# Patient Record
Sex: Female | Born: 1945 | Smoking: Never smoker
Health system: Southern US, Community
[De-identification: ages and names within clinical notes are randomized; demographics above are authoritative.]

## PROBLEM LIST (undated history)

## (undated) DIAGNOSIS — Z789 Other specified health status: Secondary | ICD-10-CM

## (undated) DIAGNOSIS — K2270 Barrett's esophagus without dysplasia: Secondary | ICD-10-CM

## (undated) DIAGNOSIS — K219 Gastro-esophageal reflux disease without esophagitis: Secondary | ICD-10-CM

## (undated) DIAGNOSIS — K8020 Calculus of gallbladder without cholecystitis without obstruction: Secondary | ICD-10-CM

## (undated) DIAGNOSIS — E785 Hyperlipidemia, unspecified: Secondary | ICD-10-CM

## (undated) DIAGNOSIS — I10 Essential (primary) hypertension: Secondary | ICD-10-CM

## (undated) DIAGNOSIS — K317 Polyp of stomach and duodenum: Secondary | ICD-10-CM

## (undated) DIAGNOSIS — G2581 Restless legs syndrome: Secondary | ICD-10-CM

## (undated) DIAGNOSIS — E119 Type 2 diabetes mellitus without complications: Secondary | ICD-10-CM

## (undated) HISTORY — PX: ABDOMINAL HYSTERECTOMY: SHX81

## (undated) HISTORY — DX: Other specified health status: Z78.9

## (undated) HISTORY — DX: Essential (primary) hypertension: I10

## (undated) HISTORY — DX: Polyp of stomach and duodenum: K31.7

## (undated) HISTORY — DX: Restless legs syndrome: G25.81

## (undated) HISTORY — DX: Type 2 diabetes mellitus without complications: E11.9

## (undated) HISTORY — DX: Gastro-esophageal reflux disease without esophagitis: K21.9

## (undated) HISTORY — DX: Barrett's esophagus without dysplasia: K22.70

## (undated) HISTORY — PX: LAPAROSCOPIC OOPHERECTOMY: SHX6507

## (undated) HISTORY — PX: ECTOPIC PREGNANCY SURGERY: SHX613

## (undated) HISTORY — PX: CATARACT EXTRACTION: SUR2

## (undated) HISTORY — DX: Hyperlipidemia, unspecified: E78.5

## (undated) HISTORY — DX: Calculus of gallbladder without cholecystitis without obstruction: K80.20

---

## 2020-01-23 ENCOUNTER — Ambulatory Visit: Payer: Medicare HMO | Attending: Internal Medicine

## 2020-01-23 DIAGNOSIS — Z23 Encounter for immunization: Secondary | ICD-10-CM | POA: Insufficient documentation

## 2020-01-23 NOTE — Progress Notes (Signed)
   Covid-19 Vaccination Clinic  Name:  Joy Mercado    MRN: [id number] DOB: 11-Jun-1946  01/23/2020  [name] was observed post Covid-19 immunization for 15 minutes without incidence. She was provided with Vaccine Information Sheet and instruction to access the V-Safe system.   [name] was instructed to call 911 with any severe reactions post vaccine: Marland Kitchen Difficulty breathing  . Swelling of your face and throat  . A fast heartbeat  . A bad rash all over your body  . Dizziness and weakness    Immunizations Administered    Name Date Dose VIS Date Route   Pfizer COVID-19 Vaccine 01/23/2020  8:16 AM 0.3 mL 11/07/2019 Intramuscular   Manufacturer: [place]   Lot: [id number]   [place]: [id number]

## 2020-02-17 ENCOUNTER — Ambulatory Visit: Payer: Medicare HMO | Attending: Internal Medicine

## 2020-02-17 DIAGNOSIS — Z23 Encounter for immunization: Secondary | ICD-10-CM

## 2020-02-17 NOTE — Progress Notes (Signed)
   Covid-19 Vaccination Clinic  Name:  JAMILEE VOLNER    MRN: [id number] DOB: 09-20-1946  02/17/2020  Ms. Labelle was observed post Covid-19 immunization for 15 minutes without incident. She was provided with Vaccine Information Sheet and instruction to access the V-Safe system.   Ms. Meisler was instructed to call 911 with any severe reactions post vaccine: Marland Kitchen Difficulty breathing  . Swelling of face and throat  . A fast heartbeat  . A bad rash all over body  . Dizziness and weakness   Immunizations Administered    Name Date Dose VIS Date Route   Pfizer COVID-19 Vaccine 02/17/2020  8:44 AM 0.3 mL 11/07/2019 Intramuscular   Manufacturer: [place]   Lot: [id number]   [place]: [id number]

## 2020-07-19 DIAGNOSIS — E782 Mixed hyperlipidemia: Secondary | ICD-10-CM | POA: Diagnosis not present

## 2020-07-19 DIAGNOSIS — I10 Essential (primary) hypertension: Secondary | ICD-10-CM | POA: Diagnosis not present

## 2020-07-19 DIAGNOSIS — E1165 Type 2 diabetes mellitus with hyperglycemia: Secondary | ICD-10-CM | POA: Diagnosis not present

## 2020-07-19 DIAGNOSIS — K219 Gastro-esophageal reflux disease without esophagitis: Secondary | ICD-10-CM | POA: Diagnosis not present

## 2020-07-19 DIAGNOSIS — Z8719 Personal history of other diseases of the digestive system: Secondary | ICD-10-CM | POA: Diagnosis not present

## 2020-07-26 DIAGNOSIS — E1165 Type 2 diabetes mellitus with hyperglycemia: Secondary | ICD-10-CM | POA: Diagnosis not present

## 2020-07-26 DIAGNOSIS — E782 Mixed hyperlipidemia: Secondary | ICD-10-CM | POA: Diagnosis not present

## 2020-07-26 DIAGNOSIS — I10 Essential (primary) hypertension: Secondary | ICD-10-CM | POA: Diagnosis not present

## 2020-08-06 DIAGNOSIS — I10 Essential (primary) hypertension: Secondary | ICD-10-CM | POA: Diagnosis not present

## 2020-08-06 DIAGNOSIS — E1165 Type 2 diabetes mellitus with hyperglycemia: Secondary | ICD-10-CM | POA: Diagnosis not present

## 2020-08-06 DIAGNOSIS — E782 Mixed hyperlipidemia: Secondary | ICD-10-CM | POA: Diagnosis not present

## 2020-08-10 DIAGNOSIS — I10 Essential (primary) hypertension: Secondary | ICD-10-CM | POA: Diagnosis not present

## 2020-08-10 DIAGNOSIS — E1165 Type 2 diabetes mellitus with hyperglycemia: Secondary | ICD-10-CM | POA: Diagnosis not present

## 2020-08-10 DIAGNOSIS — E782 Mixed hyperlipidemia: Secondary | ICD-10-CM | POA: Diagnosis not present

## 2020-08-16 DIAGNOSIS — I10 Essential (primary) hypertension: Secondary | ICD-10-CM | POA: Diagnosis not present

## 2020-08-16 DIAGNOSIS — E782 Mixed hyperlipidemia: Secondary | ICD-10-CM | POA: Diagnosis not present

## 2020-08-16 DIAGNOSIS — E1165 Type 2 diabetes mellitus with hyperglycemia: Secondary | ICD-10-CM | POA: Diagnosis not present

## 2020-08-23 DIAGNOSIS — Z23 Encounter for immunization: Secondary | ICD-10-CM | POA: Diagnosis not present

## 2020-09-17 DIAGNOSIS — E782 Mixed hyperlipidemia: Secondary | ICD-10-CM | POA: Diagnosis not present

## 2020-09-17 DIAGNOSIS — R195 Other fecal abnormalities: Secondary | ICD-10-CM | POA: Diagnosis not present

## 2020-09-21 DIAGNOSIS — E782 Mixed hyperlipidemia: Secondary | ICD-10-CM | POA: Diagnosis not present

## 2020-09-21 DIAGNOSIS — Z789 Other specified health status: Secondary | ICD-10-CM | POA: Diagnosis not present

## 2020-09-21 DIAGNOSIS — K219 Gastro-esophageal reflux disease without esophagitis: Secondary | ICD-10-CM | POA: Diagnosis not present

## 2020-09-21 DIAGNOSIS — G2581 Restless legs syndrome: Secondary | ICD-10-CM | POA: Diagnosis not present

## 2020-09-21 DIAGNOSIS — K317 Polyp of stomach and duodenum: Secondary | ICD-10-CM | POA: Diagnosis not present

## 2020-09-21 DIAGNOSIS — E1165 Type 2 diabetes mellitus with hyperglycemia: Secondary | ICD-10-CM | POA: Diagnosis not present

## 2020-09-22 DIAGNOSIS — Z961 Presence of intraocular lens: Secondary | ICD-10-CM | POA: Diagnosis not present

## 2020-09-22 DIAGNOSIS — H35033 Hypertensive retinopathy, bilateral: Secondary | ICD-10-CM | POA: Diagnosis not present

## 2020-09-22 DIAGNOSIS — H26491 Other secondary cataract, right eye: Secondary | ICD-10-CM | POA: Diagnosis not present

## 2020-09-22 DIAGNOSIS — E119 Type 2 diabetes mellitus without complications: Secondary | ICD-10-CM | POA: Diagnosis not present

## 2020-09-22 DIAGNOSIS — H2512 Age-related nuclear cataract, left eye: Secondary | ICD-10-CM | POA: Diagnosis not present

## 2020-09-22 DIAGNOSIS — H40013 Open angle with borderline findings, low risk, bilateral: Secondary | ICD-10-CM | POA: Diagnosis not present

## 2020-10-28 DIAGNOSIS — Z0000 Encounter for general adult medical examination without abnormal findings: Secondary | ICD-10-CM | POA: Diagnosis not present

## 2020-10-28 DIAGNOSIS — Z1331 Encounter for screening for depression: Secondary | ICD-10-CM | POA: Diagnosis not present

## 2020-10-28 DIAGNOSIS — Z1339 Encounter for screening examination for other mental health and behavioral disorders: Secondary | ICD-10-CM | POA: Diagnosis not present

## 2020-11-02 DIAGNOSIS — Z23 Encounter for immunization: Secondary | ICD-10-CM | POA: Diagnosis not present

## 2020-11-12 ENCOUNTER — Other Ambulatory Visit: Payer: Self-pay | Admitting: Family Medicine

## 2020-11-12 DIAGNOSIS — E2839 Other primary ovarian failure: Secondary | ICD-10-CM

## 2020-11-12 DIAGNOSIS — Z1231 Encounter for screening mammogram for malignant neoplasm of breast: Secondary | ICD-10-CM

## 2020-11-29 ENCOUNTER — Ambulatory Visit: Payer: Self-pay | Admitting: Gastroenterology

## 2020-11-30 DIAGNOSIS — E1165 Type 2 diabetes mellitus with hyperglycemia: Secondary | ICD-10-CM | POA: Diagnosis not present

## 2020-11-30 DIAGNOSIS — I10 Essential (primary) hypertension: Secondary | ICD-10-CM | POA: Diagnosis not present

## 2020-11-30 DIAGNOSIS — E782 Mixed hyperlipidemia: Secondary | ICD-10-CM | POA: Diagnosis not present

## 2020-12-02 DIAGNOSIS — K219 Gastro-esophageal reflux disease without esophagitis: Secondary | ICD-10-CM | POA: Diagnosis not present

## 2020-12-02 DIAGNOSIS — E1165 Type 2 diabetes mellitus with hyperglycemia: Secondary | ICD-10-CM | POA: Diagnosis not present

## 2020-12-02 DIAGNOSIS — E782 Mixed hyperlipidemia: Secondary | ICD-10-CM | POA: Diagnosis not present

## 2020-12-02 DIAGNOSIS — I10 Essential (primary) hypertension: Secondary | ICD-10-CM | POA: Diagnosis not present

## 2021-01-04 ENCOUNTER — Encounter: Payer: Self-pay | Admitting: Gastroenterology

## 2021-01-04 ENCOUNTER — Ambulatory Visit: Payer: Medicare HMO | Admitting: Gastroenterology

## 2021-01-04 VITALS — BP 112/70 | HR 68 | Ht 60.0 in | Wt 133.1 lb

## 2021-01-04 DIAGNOSIS — K219 Gastro-esophageal reflux disease without esophagitis: Secondary | ICD-10-CM | POA: Diagnosis not present

## 2021-01-04 DIAGNOSIS — K31A11 Gastric intestinal metaplasia without dysplasia, involving the antrum: Secondary | ICD-10-CM

## 2021-01-04 MED ORDER — PLENVU 140 G PO SOLR: 1.0000 | Freq: Once | ORAL | 0 refills | Status: AC

## 2021-01-04 NOTE — Patient Instructions (Signed)
You have been scheduled for an endoscopy and colonoscopy. Please follow the written instructions given to you at your visit today. Please pick up your prep supplies at the pharmacy within the next 1-3 days. If you use inhalers (even only as needed), please bring them with you on the day of your procedure.  Thank you for choosing me and [facility].  Pricilla Riffle. Dagoberto Ligas., MD., Marval Regal

## 2021-01-04 NOTE — Progress Notes (Signed)
History of Present Illness: This is a 75 year old female referred by Fanny Bien, MD for the evaluation of GERD and a history of gastric intestinal metaplasia.  She is accompanied by her husband.  They relocated here about 2 years ago from [place] where they lived for many years.  Last EGD as below. She is also due for screening colonoscopy.  She feels her last colonoscopy was done in 2011.  She has GERD that is well controlled on daily omeprazole.  A few months ago she developed a change in bowel habits with 1-3 looser, more urgent bowel movements daily.  Prior bowel habits were 1 formed bowel movement per day.  No other gastrointestinal complaints. Denies weight loss, abdominal pain, constipation, diarrhea, change in stool caliber, melena, hematochezia, nausea, vomiting, dysphagia, reflux symptoms, chest pain.   EGD 08/2017 in [place], [place] [facility] - mild chronic inflammation, no intestinal metaplasia Altered antral mucosal texture -  Intestinal metaplasia. No H pylori   No Known Allergies Outpatient Medications Prior to Visit  Medication Sig Dispense Refill  . ezetimibe (ZETIA) 10 MG tablet Take 10 mg by mouth daily.    Marland Kitchen lisinopril (ZESTRIL) 10 MG tablet Take 10 mg by mouth daily.    Marland Kitchen omeprazole (PRILOSEC) 20 MG capsule Take 20 mg by mouth daily.    . metFORMIN (GLUCOPHAGE) 500 MG tablet Take by mouth 2 (two) times daily with a meal.    . metFORMIN (GLUCOPHAGE-XR) 500 MG 24 hr tablet Take 500 mg by mouth every evening.    Marland Kitchen omeprazole (PRILOSEC) 40 MG capsule Take 40 mg by mouth daily.    . simvastatin (ZOCOR) 10 MG tablet Take 10 mg by mouth daily.     No facility-administered medications prior to visit.   Past Medical History:  Diagnosis Date  . Barrett's esophagus   . Diabetes ([place])   . Gallstones   . Gastric polyp   . GERD (gastroesophageal reflux disease)   . Hyperlipidemia   . Hypertension   . RLS (restless legs syndrome)   . Statin intolerance     Past Surgical History:  Procedure Laterality Date  . ABDOMINAL HYSTERECTOMY    . CATARACT EXTRACTION Right   . ECTOPIC PREGNANCY SURGERY     x 2  . LAPAROSCOPIC OOPHERECTOMY  1970's   Social History   Socioeconomic History  . Marital status: Married    Spouse name: Not on file  . Number of children: 0  . Years of education: Not on file  . Highest education level: Not on file  Occupational History  . Occupation: retired  Tobacco Use  . Smoking status: Former Smoker    Quit date: 01/05/1988    Years since quitting: 33.0  . Smokeless tobacco: Never Used  Vaping Use  . Vaping Use: Never used  Substance and Sexual Activity  . Alcohol use: Yes    Alcohol/week: 2.0 standard drinks    Types: 2 Glasses of wine per week    Comment: 2-3 wines a week  . Drug use: Never  . Sexual activity: Not on file  Other Topics Concern  . Not on file  Social History Narrative  . Not on file   Social Determinants of Health   Financial Resource Strain: Not on file  Food Insecurity: Not on file  Transportation Needs: Not on file  Physical Activity: Not on file  Stress: Not on file  Social Connections: Not on file   Family History  Family history  unknown: Yes      Review of Systems: Pertinent positive and negative review of systems were noted in the above HPI section. All other review of systems were otherwise negative.    Physical Exam: General: Well developed, well nourished, no acute distress Head: Normocephalic and atraumatic Eyes: Sclerae anicteric, EOMI Ears: Normal auditory acuity Mouth: Not examined, mask on during Covid-19 pandemic Neck: Supple, no masses or thyromegaly Lungs: Clear throughout to auscultation Heart: Regular rate and rhythm; no murmurs, rubs or bruits Abdomen: Soft, non tender and non distended. No masses, hepatosplenomegaly or hernias noted. Normal Bowel sounds Rectal: Not done Musculoskeletal: Symmetrical with no gross deformities  Skin: No lesions on  visible extremities Pulses:  Normal pulses noted Extremities: No clubbing, cyanosis, edema or deformities noted Neurological: Alert oriented x 4, grossly nonfocal Cervical Nodes:  No significant cervical adenopathy Inguinal Nodes: No significant inguinal adenopathy Psychological:  Alert and cooperative. Normal mood and affect   Assessment and Recommendations:  1.  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  Last EGD just over 3 years ago.  A 3-year interval surveillance endoscopy was recommended.  Schedule EGD with gastric mapping. The risks (including bleeding, perforation, infection, missed lesions, medication reactions and possible hospitalization or surgery if complications occur), benefits, and alternatives to endoscopy with possible biopsy and possible dilation were discussed with the patient and they consent to proceed.   2.  GERD.  Follow antireflux measures.  Continue omeprazole 20 mg daily.  3.  Change in bowel habits with urgent diarrhea. CRC screening, average risk.  Advised her to monitor her foods and beverages as she may have developed an intolerance.  Rule out microscopic colitis and other disorders.  Schedule colonoscopy. The risks (including bleeding, perforation, infection, missed lesions, medication reactions and possible hospitalization or surgery if complications occur), benefits, and alternatives to colonoscopy with possible biopsy and possible polypectomy were discussed with the patient and they consent to proceed.     cc: Fanny Bien, [place] [place],  [place] [address]

## 2021-01-06 DIAGNOSIS — E1165 Type 2 diabetes mellitus with hyperglycemia: Secondary | ICD-10-CM | POA: Diagnosis not present

## 2021-01-06 DIAGNOSIS — K219 Gastro-esophageal reflux disease without esophagitis: Secondary | ICD-10-CM | POA: Diagnosis not present

## 2021-01-06 DIAGNOSIS — E782 Mixed hyperlipidemia: Secondary | ICD-10-CM | POA: Diagnosis not present

## 2021-01-06 DIAGNOSIS — I10 Essential (primary) hypertension: Secondary | ICD-10-CM | POA: Diagnosis not present

## 2021-02-02 ENCOUNTER — Encounter: Payer: Self-pay | Admitting: Gastroenterology

## 2021-02-04 ENCOUNTER — Ambulatory Visit (AMBULATORY_SURGERY_CENTER): Payer: Medicare HMO | Admitting: Gastroenterology

## 2021-02-04 ENCOUNTER — Encounter: Payer: Medicare HMO | Admitting: Gastroenterology

## 2021-02-04 ENCOUNTER — Other Ambulatory Visit: Payer: Self-pay | Admitting: Gastroenterology

## 2021-02-04 ENCOUNTER — Encounter: Payer: Self-pay | Admitting: Gastroenterology

## 2021-02-04 ENCOUNTER — Other Ambulatory Visit: Payer: Self-pay

## 2021-02-04 VITALS — BP 107/65 | HR 63 | Temp 97.1°F | Resp 10 | Ht 63.0 in | Wt 133.0 lb

## 2021-02-04 DIAGNOSIS — K319 Disease of stomach and duodenum, unspecified: Secondary | ICD-10-CM

## 2021-02-04 DIAGNOSIS — Z1211 Encounter for screening for malignant neoplasm of colon: Secondary | ICD-10-CM | POA: Diagnosis not present

## 2021-02-04 DIAGNOSIS — K31A11 Gastric intestinal metaplasia without dysplasia, involving the antrum: Secondary | ICD-10-CM | POA: Diagnosis not present

## 2021-02-04 DIAGNOSIS — K31A0 Gastric intestinal metaplasia, unspecified: Secondary | ICD-10-CM | POA: Diagnosis not present

## 2021-02-04 DIAGNOSIS — D123 Benign neoplasm of transverse colon: Secondary | ICD-10-CM

## 2021-02-04 DIAGNOSIS — D124 Benign neoplasm of descending colon: Secondary | ICD-10-CM

## 2021-02-04 DIAGNOSIS — K2950 Unspecified chronic gastritis without bleeding: Secondary | ICD-10-CM | POA: Diagnosis not present

## 2021-02-04 DIAGNOSIS — K2960 Other gastritis without bleeding: Secondary | ICD-10-CM | POA: Diagnosis not present

## 2021-02-04 MED ORDER — SODIUM CHLORIDE 0.9 % IV SOLN: 500.0000 mL | Freq: Once | INTRAVENOUS | Status: DC

## 2021-02-04 NOTE — Progress Notes (Signed)
Called to room to assist during endoscopic procedure.  Patient ID and intended procedure confirmed with present staff. Received instructions for my participation in the procedure from the performing physician.  

## 2021-02-04 NOTE — Patient Instructions (Signed)
Handout given:  Polyps, diverticulosis, high fiber diet Start high fiber diet Continue current medications Await pathology results  YOU HAD AN ENDOSCOPIC PROCEDURE TODAY AT [facility]:   Refer to the procedure report that was given to you for any specific questions about what was found during the examination.  If the procedure report does not answer your questions, please call your gastroenterologist to clarify.  If you requested that your care partner not be given the details of your procedure findings, then the procedure report has been included in a sealed envelope for you to review at your convenience later.  YOU SHOULD EXPECT: Some feelings of bloating in the abdomen. Passage of more gas than usual.  Walking can help get rid of the air that was put into your GI tract during the procedure and reduce the bloating. If you had a lower endoscopy (such as a colonoscopy or flexible sigmoidoscopy) you may notice spotting of blood in your stool or on the toilet paper. If you underwent a bowel prep for your procedure, you may not have a normal bowel movement for a few days.  Please Note:  You might notice some irritation and congestion in your nose or some drainage.  This is from the oxygen used during your procedure.  There is no need for concern and it should clear up in a day or so.  SYMPTOMS TO REPORT IMMEDIATELY:   Following lower endoscopy (colonoscopy or flexible sigmoidoscopy):  Excessive amounts of blood in the stool  Significant tenderness or worsening of abdominal pains  Swelling of the abdomen that is new, acute  Fever of 100F or higher   Following upper endoscopy (EGD)  Vomiting of blood or coffee ground material  New chest pain or pain under the shoulder blades  Painful or persistently difficult swallowing  New shortness of breath  Fever of 100F or higher  Black, tarry-looking stools  For urgent or emergent issues, a gastroenterologist can be reached at any  hour by calling [phone]. Do not use MyChart messaging for urgent concerns.   DIET:  We do recommend a small meal at first, but then you may proceed to your regular diet.  Drink plenty of fluids but you should avoid alcoholic beverages for 24 hours.  ACTIVITY:  You should plan to take it easy for the rest of today and you should NOT DRIVE or use heavy machinery until tomorrow (because of the sedation medicines used during the test).    FOLLOW UP: Our staff will call the number listed on your records 48-72 hours following your procedure to check on you and address any questions or concerns that you may have regarding the information given to you following your procedure. If we do not reach you, we will leave a message.  We will attempt to reach you two times.  During this call, we will ask if you have developed any symptoms of COVID 19. If you develop any symptoms (ie: fever, flu-like symptoms, shortness of breath, cough etc.) before then, please call [phone].  If you test positive for Covid 19 in the 2 weeks post procedure, please call and report this information to [place].    If any biopsies were taken you will be contacted by phone or by letter within the next 1-3 weeks.  Please call us at [phone] if you have not heard about the biopsies in 3 weeks.   SIGNATURES/CONFIDENTIALITY: You and/or your care partner have signed paperwork which will be entered into  your electronic medical record.  These signatures attest to the fact that that the information above on your After Visit Summary has been reviewed and is understood.  Full responsibility of the confidentiality of this discharge information lies with you and/or your care-partner.

## 2021-02-04 NOTE — Op Note (Signed)
[place] Patient Name: Joy Mercado Procedure Date: 02/04/2021 1:33 PM MRN: [id number] Endoscopist: Ladene Artist , MD Age: 75 Referring MD:  Date of Birth: 1946/11/20 Gender: Female Account #: [id number] Procedure:                Colonoscopy Indications:              Screening for colorectal malignant neoplasm Medicines:                Monitored Anesthesia Care Procedure:                Pre-Anesthesia Assessment:                           - Prior to the procedure, a History and Physical                            was performed, and patient medications and                            allergies were reviewed. The patient's tolerance of                            previous anesthesia was also reviewed. The risks                            and benefits of the procedure and the sedation                            options and risks were discussed with the patient.                            All questions were answered, and informed consent                            was obtained. Prior Anticoagulants: The patient has                            taken no previous anticoagulant or antiplatelet                            agents. ASA Grade Assessment: II - A patient with                            mild systemic disease. After reviewing the risks                            and benefits, the patient was deemed in                            satisfactory condition to undergo the procedure.                           After obtaining informed consent, the colonoscope  was passed under direct vision. Throughout the                            procedure, the patient's blood pressure, pulse, and                            oxygen saturations were monitored continuously. The                            Olympus PFC-[id number]) Colonoscope was                            introduced through the anus and advanced to the the                            cecum, identified by  appendiceal orifice and                            ileocecal valve. The ileocecal valve, appendiceal                            orifice, and rectum were photographed. The quality                            of the bowel preparation was good. The colonoscopy                            was performed without difficulty. The patient                            tolerated the procedure well. Scope In: 1:47:25 PM Scope Out: 2:06:27 PM Scope Withdrawal Time: 0 hours 13 minutes 52 seconds  Total Procedure Duration: 0 hours 19 minutes 2 seconds  Findings:                 The perianal and digital rectal examinations were                            normal.                           Two sessile polyps were found in the descending                            colon and transverse colon. The polyps were 6 to 7                            mm in size. These polyps were removed with a cold                            snare. Resection and retrieval were complete.                           Multiple medium-mouthed diverticula were found in  the right colon. There was evidence of an impacted                            diverticulum.                           A tattoo was seen in the sigmoid colon. The tattoo                            site appeared normal.                           The exam was otherwise without abnormality on                            direct and retroflexion views. Complications:            No immediate complications. Estimated blood loss:                            None. Estimated Blood Loss:     Estimated blood loss: none. Impression:               - Two 6 to 7 mm polyps in the descending colon and                            in the transverse colon, removed with a cold snare.                            Resected and retrieved.                           - Moderate diverticulosis in the right colon.                           - A tattoo was seen in the sigmoid colon. The                             tattoo site appeared normal.                           - The examination was otherwise normal on direct                            and retroflexion views. Recommendation:           - Repeat colonoscopy after studies are complete for                            surveillance based on pathology results.                           - Patient has a contact number available for                            emergencies. The signs and symptoms of  potential                            delayed complications were discussed with the                            patient. Return to normal activities tomorrow.                            Written discharge instructions were provided to the                            patient.                           - High fiber diet.                           - Continue present medications.                           - Await pathology results. Ladene Artist, MD 02/04/2021 2:09:48 PM This report has been signed electronically.

## 2021-02-04 NOTE — Progress Notes (Signed)
PT taken to PACU. Monitors in place. VSS. Report given to RN. 

## 2021-02-04 NOTE — Progress Notes (Signed)
Vitals-CW  History reviewed.  Patient states ringing in L ear started this morning. Denies nausea and chest pain.

## 2021-02-04 NOTE — Op Note (Signed)
[facility] Patient Name: Joy Mercado Procedure Date: 02/04/2021 1:33 PM MRN: [id number] Endoscopist: Ladene Artist , MD Age: 75 Referring MD:  Date of Birth: December 01, 1945 Gender: Female Account #: [id number] Procedure:                Upper GI endoscopy Indications:              Surveillance procedure: Gastric intestinal                            metaplasia. Medicines:                Monitored Anesthesia Care Procedure:                Pre-Anesthesia Assessment:                           - Prior to the procedure, a History and Physical                            was performed, and patient medications and                            allergies were reviewed. The patient's tolerance of                            previous anesthesia was also reviewed. The risks                            and benefits of the procedure and the sedation                            options and risks were discussed with the patient.                            All questions were answered, and informed consent                            was obtained. Prior Anticoagulants: The patient has                            taken no previous anticoagulant or antiplatelet                            agents. ASA Grade Assessment: II - A patient with                            mild systemic disease. After reviewing the risks                            and benefits, the patient was deemed in                            satisfactory condition to undergo the procedure.  After obtaining informed consent, the endoscope was                            passed under direct vision. Throughout the                            procedure, the patient's blood pressure, pulse, and                            oxygen saturations were monitored continuously. The                            Endoscope was introduced through the mouth, and                            advanced to the second part of duodenum. The upper                             GI endoscopy was accomplished without difficulty.                            The patient tolerated the procedure well. Scope In: Scope Out: Findings:                 The examined esophagus was normal.                           Diffuse mildly erythematous mucosa without bleeding                            was found in the gastric antrum. Biopsies were                            taken from the lesser and greater curvatures with a                            cold forceps for histology.                           The gastric body was normal. Biopsies were taken                            the lesser and greater curvatures and from the                            incisura with a cold forceps for histology.                           The cardia and gastric fundus were normal.                           The duodenal bulb and second portion of the  duodenum were normal. Complications:            No immediate complications. Estimated Blood Loss:     Estimated blood loss was minimal. Impression:               - Normal esophagus.                           - Erythematous mucosa in the antrum. Biopsied.                           - Normal gastric body. Biopsied.                           - Normal cardia and gastric fundus.                           - Normal duodenal bulb and second portion of the                            duodenum. Recommendation:           - Patient has a contact number available for                            emergencies. The signs and symptoms of potential                            delayed complications were discussed with the                            patient. Return to normal activities tomorrow.                            Written discharge instructions were provided to the                            patient.                           - Resume previous diet.                           - Continue present medications.                            - Await pathology results.                           - Repeat upper endoscopy in 3 years for                            surveillance pending pathology review. Ladene Artist, MD 02/04/2021 2:25:22 PM This report has been signed electronically.

## 2021-02-09 ENCOUNTER — Telehealth: Payer: Self-pay | Admitting: *Deleted

## 2021-02-09 NOTE — Telephone Encounter (Signed)
  Follow up Call-  Call back number 02/04/2021  Post procedure Call Back phone  # [phone]  husband  Permission to leave phone message Yes     Patient questions:  Do you have a fever, pain , or abdominal swelling? No. Pain Score  0 *  Have you tolerated food without any problems? No.  Have you been able to return to your normal activities? yes  Do you have any questions about your discharge instructions: Diet   No. Medications  No. Follow up visit  No.  Do you have questions or concerns about your Care? No.  Actions: * If pain score is 4 or above: No action needed, pain <4.   1. Have you developed a fever since your procedure? no  2.   Have you had an respiratory symptoms (SOB or cough) since your procedure? no  3.   Have you tested positive for COVID 19 since your procedure no  4.   Have you had any family members/close contacts diagnosed with the COVID 19 since your procedure?  no   If yes to any of these questions please route to Joylene John, RN and Joella Prince, RN

## 2021-02-09 NOTE — Telephone Encounter (Signed)
No answer for post procedure call back. Left message and will call back this afternoon.

## 2021-02-16 ENCOUNTER — Encounter: Payer: Self-pay | Admitting: Gastroenterology

## 2021-02-23 ENCOUNTER — Ambulatory Visit: Payer: Self-pay

## 2021-02-23 ENCOUNTER — Other Ambulatory Visit: Payer: Self-pay

## 2021-02-24 DIAGNOSIS — E1165 Type 2 diabetes mellitus with hyperglycemia: Secondary | ICD-10-CM | POA: Diagnosis not present

## 2021-02-24 DIAGNOSIS — E782 Mixed hyperlipidemia: Secondary | ICD-10-CM | POA: Diagnosis not present

## 2021-03-01 DIAGNOSIS — I152 Hypertension secondary to endocrine disorders: Secondary | ICD-10-CM | POA: Diagnosis not present

## 2021-03-01 DIAGNOSIS — E1165 Type 2 diabetes mellitus with hyperglycemia: Secondary | ICD-10-CM | POA: Diagnosis not present

## 2021-03-01 DIAGNOSIS — I10 Essential (primary) hypertension: Secondary | ICD-10-CM | POA: Diagnosis not present

## 2021-03-01 DIAGNOSIS — K635 Polyp of colon: Secondary | ICD-10-CM | POA: Diagnosis not present

## 2021-03-01 DIAGNOSIS — K219 Gastro-esophageal reflux disease without esophagitis: Secondary | ICD-10-CM | POA: Diagnosis not present

## 2021-03-10 DIAGNOSIS — E1165 Type 2 diabetes mellitus with hyperglycemia: Secondary | ICD-10-CM | POA: Diagnosis not present

## 2021-03-10 DIAGNOSIS — E782 Mixed hyperlipidemia: Secondary | ICD-10-CM | POA: Diagnosis not present

## 2021-03-10 DIAGNOSIS — I10 Essential (primary) hypertension: Secondary | ICD-10-CM | POA: Diagnosis not present

## 2021-03-10 DIAGNOSIS — K219 Gastro-esophageal reflux disease without esophagitis: Secondary | ICD-10-CM | POA: Diagnosis not present

## 2021-04-12 DIAGNOSIS — K219 Gastro-esophageal reflux disease without esophagitis: Secondary | ICD-10-CM | POA: Diagnosis not present

## 2021-04-12 DIAGNOSIS — I10 Essential (primary) hypertension: Secondary | ICD-10-CM | POA: Diagnosis not present

## 2021-04-12 DIAGNOSIS — E1165 Type 2 diabetes mellitus with hyperglycemia: Secondary | ICD-10-CM | POA: Diagnosis not present

## 2021-04-15 ENCOUNTER — Other Ambulatory Visit: Payer: Self-pay

## 2021-04-15 ENCOUNTER — Ambulatory Visit
Admission: RE | Admit: 2021-04-15 | Discharge: 2021-04-15 | Disposition: A | Discharge status: Home / Self Care | Payer: Medicare HMO | Source: Ambulatory Visit | Attending: Family Medicine | Admitting: Family Medicine

## 2021-04-15 DIAGNOSIS — Z1231 Encounter for screening mammogram for malignant neoplasm of breast: Secondary | ICD-10-CM | POA: Diagnosis not present

## 2021-04-19 ENCOUNTER — Other Ambulatory Visit: Payer: Self-pay | Admitting: Family Medicine

## 2021-04-19 DIAGNOSIS — R928 Other abnormal and inconclusive findings on diagnostic imaging of breast: Secondary | ICD-10-CM

## 2021-05-11 ENCOUNTER — Other Ambulatory Visit: Payer: Self-pay

## 2021-05-11 ENCOUNTER — Ambulatory Visit: Payer: Medicare HMO

## 2021-05-11 ENCOUNTER — Ambulatory Visit
Admission: RE | Admit: 2021-05-11 | Discharge: 2021-05-11 | Disposition: A | Discharge status: Home / Self Care | Payer: Medicare HMO | Source: Ambulatory Visit | Attending: Family Medicine | Admitting: Family Medicine

## 2021-05-11 DIAGNOSIS — R928 Other abnormal and inconclusive findings on diagnostic imaging of breast: Secondary | ICD-10-CM

## 2021-05-11 DIAGNOSIS — R922 Inconclusive mammogram: Secondary | ICD-10-CM | POA: Diagnosis not present

## 2021-05-25 DIAGNOSIS — E782 Mixed hyperlipidemia: Secondary | ICD-10-CM | POA: Diagnosis not present

## 2021-05-25 DIAGNOSIS — E1165 Type 2 diabetes mellitus with hyperglycemia: Secondary | ICD-10-CM | POA: Diagnosis not present

## 2021-05-25 DIAGNOSIS — I10 Essential (primary) hypertension: Secondary | ICD-10-CM | POA: Diagnosis not present

## 2021-05-25 DIAGNOSIS — K219 Gastro-esophageal reflux disease without esophagitis: Secondary | ICD-10-CM | POA: Diagnosis not present

## 2021-06-27 DIAGNOSIS — Z23 Encounter for immunization: Secondary | ICD-10-CM | POA: Diagnosis not present

## 2021-07-11 DIAGNOSIS — E1165 Type 2 diabetes mellitus with hyperglycemia: Secondary | ICD-10-CM | POA: Diagnosis not present

## 2021-07-11 DIAGNOSIS — E782 Mixed hyperlipidemia: Secondary | ICD-10-CM | POA: Diagnosis not present

## 2021-07-11 DIAGNOSIS — I10 Essential (primary) hypertension: Secondary | ICD-10-CM | POA: Diagnosis not present

## 2021-07-13 DIAGNOSIS — E782 Mixed hyperlipidemia: Secondary | ICD-10-CM | POA: Diagnosis not present

## 2021-07-13 DIAGNOSIS — K317 Polyp of stomach and duodenum: Secondary | ICD-10-CM | POA: Diagnosis not present

## 2021-07-13 DIAGNOSIS — E1165 Type 2 diabetes mellitus with hyperglycemia: Secondary | ICD-10-CM | POA: Diagnosis not present

## 2021-07-13 DIAGNOSIS — K219 Gastro-esophageal reflux disease without esophagitis: Secondary | ICD-10-CM | POA: Diagnosis not present

## 2021-07-13 DIAGNOSIS — I10 Essential (primary) hypertension: Secondary | ICD-10-CM | POA: Diagnosis not present

## 2021-07-29 ENCOUNTER — Other Ambulatory Visit: Payer: Self-pay | Admitting: Family Medicine

## 2021-07-29 DIAGNOSIS — E2839 Other primary ovarian failure: Secondary | ICD-10-CM

## 2021-08-03 ENCOUNTER — Other Ambulatory Visit: Payer: Medicare HMO

## 2021-08-08 DIAGNOSIS — Z23 Encounter for immunization: Secondary | ICD-10-CM | POA: Diagnosis not present

## 2021-10-06 DIAGNOSIS — E782 Mixed hyperlipidemia: Secondary | ICD-10-CM | POA: Diagnosis not present

## 2021-10-06 DIAGNOSIS — E1165 Type 2 diabetes mellitus with hyperglycemia: Secondary | ICD-10-CM | POA: Diagnosis not present

## 2021-10-13 DIAGNOSIS — I10 Essential (primary) hypertension: Secondary | ICD-10-CM | POA: Diagnosis not present

## 2021-10-13 DIAGNOSIS — E1165 Type 2 diabetes mellitus with hyperglycemia: Secondary | ICD-10-CM | POA: Diagnosis not present

## 2021-10-13 DIAGNOSIS — K219 Gastro-esophageal reflux disease without esophagitis: Secondary | ICD-10-CM | POA: Diagnosis not present

## 2021-10-13 DIAGNOSIS — M722 Plantar fascial fibromatosis: Secondary | ICD-10-CM | POA: Diagnosis not present

## 2021-10-13 DIAGNOSIS — E782 Mixed hyperlipidemia: Secondary | ICD-10-CM | POA: Diagnosis not present

## 2021-10-31 DIAGNOSIS — Z1331 Encounter for screening for depression: Secondary | ICD-10-CM | POA: Diagnosis not present

## 2021-10-31 DIAGNOSIS — Z1339 Encounter for screening examination for other mental health and behavioral disorders: Secondary | ICD-10-CM | POA: Diagnosis not present

## 2021-10-31 DIAGNOSIS — E782 Mixed hyperlipidemia: Secondary | ICD-10-CM | POA: Diagnosis not present

## 2021-10-31 DIAGNOSIS — M722 Plantar fascial fibromatosis: Secondary | ICD-10-CM | POA: Diagnosis not present

## 2021-10-31 DIAGNOSIS — Z0000 Encounter for general adult medical examination without abnormal findings: Secondary | ICD-10-CM | POA: Diagnosis not present

## 2021-11-15 DIAGNOSIS — H52203 Unspecified astigmatism, bilateral: Secondary | ICD-10-CM | POA: Diagnosis not present

## 2021-11-15 DIAGNOSIS — H26491 Other secondary cataract, right eye: Secondary | ICD-10-CM | POA: Diagnosis not present

## 2021-11-15 DIAGNOSIS — E119 Type 2 diabetes mellitus without complications: Secondary | ICD-10-CM | POA: Diagnosis not present

## 2021-11-15 DIAGNOSIS — H2512 Age-related nuclear cataract, left eye: Secondary | ICD-10-CM | POA: Diagnosis not present

## 2021-11-22 DIAGNOSIS — Z23 Encounter for immunization: Secondary | ICD-10-CM | POA: Diagnosis not present

## 2021-12-13 ENCOUNTER — Ambulatory Visit
Admission: RE | Admit: 2021-12-13 | Discharge: 2021-12-13 | Disposition: A | Discharge status: Home / Self Care | Payer: Medicare HMO | Source: Ambulatory Visit | Attending: Family Medicine | Admitting: Family Medicine

## 2021-12-13 DIAGNOSIS — Z780 Asymptomatic menopausal state: Secondary | ICD-10-CM | POA: Diagnosis not present

## 2021-12-13 DIAGNOSIS — M85832 Other specified disorders of bone density and structure, left forearm: Secondary | ICD-10-CM | POA: Diagnosis not present

## 2021-12-13 DIAGNOSIS — E2839 Other primary ovarian failure: Secondary | ICD-10-CM

## 2022-01-09 DIAGNOSIS — E559 Vitamin D deficiency, unspecified: Secondary | ICD-10-CM | POA: Diagnosis not present

## 2022-01-09 DIAGNOSIS — K219 Gastro-esophageal reflux disease without esophagitis: Secondary | ICD-10-CM | POA: Diagnosis not present

## 2022-01-09 DIAGNOSIS — E782 Mixed hyperlipidemia: Secondary | ICD-10-CM | POA: Diagnosis not present

## 2022-01-09 DIAGNOSIS — E1165 Type 2 diabetes mellitus with hyperglycemia: Secondary | ICD-10-CM | POA: Diagnosis not present

## 2022-01-12 DIAGNOSIS — M8580 Other specified disorders of bone density and structure, unspecified site: Secondary | ICD-10-CM | POA: Diagnosis not present

## 2022-01-12 DIAGNOSIS — E1121 Type 2 diabetes mellitus with diabetic nephropathy: Secondary | ICD-10-CM | POA: Diagnosis not present

## 2022-01-12 DIAGNOSIS — K219 Gastro-esophageal reflux disease without esophagitis: Secondary | ICD-10-CM | POA: Diagnosis not present

## 2022-01-12 DIAGNOSIS — E1165 Type 2 diabetes mellitus with hyperglycemia: Secondary | ICD-10-CM | POA: Diagnosis not present

## 2022-01-12 DIAGNOSIS — I10 Essential (primary) hypertension: Secondary | ICD-10-CM | POA: Diagnosis not present

## 2022-01-12 DIAGNOSIS — E782 Mixed hyperlipidemia: Secondary | ICD-10-CM | POA: Diagnosis not present

## 2022-02-06 DIAGNOSIS — K219 Gastro-esophageal reflux disease without esophagitis: Secondary | ICD-10-CM | POA: Diagnosis not present

## 2022-02-06 DIAGNOSIS — I10 Essential (primary) hypertension: Secondary | ICD-10-CM | POA: Diagnosis not present

## 2022-02-06 DIAGNOSIS — M8580 Other specified disorders of bone density and structure, unspecified site: Secondary | ICD-10-CM | POA: Diagnosis not present

## 2022-02-06 DIAGNOSIS — G4700 Insomnia, unspecified: Secondary | ICD-10-CM | POA: Diagnosis not present

## 2022-02-07 DIAGNOSIS — R109 Unspecified abdominal pain: Secondary | ICD-10-CM | POA: Diagnosis not present

## 2022-04-07 DIAGNOSIS — E1165 Type 2 diabetes mellitus with hyperglycemia: Secondary | ICD-10-CM | POA: Diagnosis not present

## 2022-04-07 DIAGNOSIS — I10 Essential (primary) hypertension: Secondary | ICD-10-CM | POA: Diagnosis not present

## 2022-04-07 DIAGNOSIS — E559 Vitamin D deficiency, unspecified: Secondary | ICD-10-CM | POA: Diagnosis not present

## 2022-04-12 ENCOUNTER — Other Ambulatory Visit: Payer: Self-pay | Admitting: Family Medicine

## 2022-04-12 DIAGNOSIS — I10 Essential (primary) hypertension: Secondary | ICD-10-CM | POA: Diagnosis not present

## 2022-04-12 DIAGNOSIS — E1165 Type 2 diabetes mellitus with hyperglycemia: Secondary | ICD-10-CM | POA: Diagnosis not present

## 2022-04-12 DIAGNOSIS — E559 Vitamin D deficiency, unspecified: Secondary | ICD-10-CM | POA: Diagnosis not present

## 2022-04-12 DIAGNOSIS — Z1231 Encounter for screening mammogram for malignant neoplasm of breast: Secondary | ICD-10-CM

## 2022-04-18 ENCOUNTER — Ambulatory Visit
Admission: RE | Admit: 2022-04-18 | Discharge: 2022-04-18 | Disposition: A | Discharge status: Home / Self Care | Payer: Medicare HMO | Source: Ambulatory Visit | Attending: Family Medicine | Admitting: Family Medicine

## 2022-04-18 DIAGNOSIS — Z1231 Encounter for screening mammogram for malignant neoplasm of breast: Secondary | ICD-10-CM | POA: Diagnosis not present

## 2022-05-17 DIAGNOSIS — H25012 Cortical age-related cataract, left eye: Secondary | ICD-10-CM | POA: Diagnosis not present

## 2022-05-17 DIAGNOSIS — H26491 Other secondary cataract, right eye: Secondary | ICD-10-CM | POA: Diagnosis not present

## 2022-05-17 DIAGNOSIS — H2512 Age-related nuclear cataract, left eye: Secondary | ICD-10-CM | POA: Diagnosis not present

## 2022-05-17 DIAGNOSIS — E119 Type 2 diabetes mellitus without complications: Secondary | ICD-10-CM | POA: Diagnosis not present

## 2022-06-06 DIAGNOSIS — H26491 Other secondary cataract, right eye: Secondary | ICD-10-CM | POA: Diagnosis not present

## 2022-07-04 DIAGNOSIS — I10 Essential (primary) hypertension: Secondary | ICD-10-CM | POA: Diagnosis not present

## 2022-07-04 DIAGNOSIS — E1165 Type 2 diabetes mellitus with hyperglycemia: Secondary | ICD-10-CM | POA: Diagnosis not present

## 2022-07-04 DIAGNOSIS — E782 Mixed hyperlipidemia: Secondary | ICD-10-CM | POA: Diagnosis not present

## 2022-07-06 DIAGNOSIS — H269 Unspecified cataract: Secondary | ICD-10-CM | POA: Diagnosis not present

## 2022-07-06 DIAGNOSIS — H2512 Age-related nuclear cataract, left eye: Secondary | ICD-10-CM | POA: Diagnosis not present

## 2022-07-06 DIAGNOSIS — H25812 Combined forms of age-related cataract, left eye: Secondary | ICD-10-CM | POA: Diagnosis not present

## 2022-07-06 DIAGNOSIS — H25012 Cortical age-related cataract, left eye: Secondary | ICD-10-CM | POA: Diagnosis not present

## 2022-07-12 DIAGNOSIS — E1143 Type 2 diabetes mellitus with diabetic autonomic (poly)neuropathy: Secondary | ICD-10-CM | POA: Diagnosis not present

## 2022-07-12 DIAGNOSIS — Z23 Encounter for immunization: Secondary | ICD-10-CM | POA: Diagnosis not present

## 2022-07-12 DIAGNOSIS — E1165 Type 2 diabetes mellitus with hyperglycemia: Secondary | ICD-10-CM | POA: Diagnosis not present

## 2022-07-12 DIAGNOSIS — E782 Mixed hyperlipidemia: Secondary | ICD-10-CM | POA: Diagnosis not present

## 2022-07-12 DIAGNOSIS — Z7185 Encounter for immunization safety counseling: Secondary | ICD-10-CM | POA: Diagnosis not present

## 2022-07-12 DIAGNOSIS — I10 Essential (primary) hypertension: Secondary | ICD-10-CM | POA: Diagnosis not present

## 2022-10-13 DIAGNOSIS — E1165 Type 2 diabetes mellitus with hyperglycemia: Secondary | ICD-10-CM | POA: Diagnosis not present

## 2022-10-16 DIAGNOSIS — E782 Mixed hyperlipidemia: Secondary | ICD-10-CM | POA: Diagnosis not present

## 2022-10-16 DIAGNOSIS — E1165 Type 2 diabetes mellitus with hyperglycemia: Secondary | ICD-10-CM | POA: Diagnosis not present

## 2022-10-16 DIAGNOSIS — I10 Essential (primary) hypertension: Secondary | ICD-10-CM | POA: Diagnosis not present

## 2022-10-16 DIAGNOSIS — K219 Gastro-esophageal reflux disease without esophagitis: Secondary | ICD-10-CM | POA: Diagnosis not present

## 2022-10-16 DIAGNOSIS — Z7185 Encounter for immunization safety counseling: Secondary | ICD-10-CM | POA: Diagnosis not present

## 2022-11-01 DIAGNOSIS — Z1331 Encounter for screening for depression: Secondary | ICD-10-CM | POA: Diagnosis not present

## 2022-11-01 DIAGNOSIS — Z0000 Encounter for general adult medical examination without abnormal findings: Secondary | ICD-10-CM | POA: Diagnosis not present

## 2022-11-01 DIAGNOSIS — Z1339 Encounter for screening examination for other mental health and behavioral disorders: Secondary | ICD-10-CM | POA: Diagnosis not present

## 2022-11-28 DIAGNOSIS — I10 Essential (primary) hypertension: Secondary | ICD-10-CM | POA: Diagnosis not present

## 2022-11-28 DIAGNOSIS — E782 Mixed hyperlipidemia: Secondary | ICD-10-CM | POA: Diagnosis not present

## 2022-12-05 DIAGNOSIS — L719 Rosacea, unspecified: Secondary | ICD-10-CM | POA: Diagnosis not present

## 2022-12-05 DIAGNOSIS — I10 Essential (primary) hypertension: Secondary | ICD-10-CM | POA: Diagnosis not present

## 2022-12-05 DIAGNOSIS — K219 Gastro-esophageal reflux disease without esophagitis: Secondary | ICD-10-CM | POA: Diagnosis not present

## 2022-12-05 DIAGNOSIS — E782 Mixed hyperlipidemia: Secondary | ICD-10-CM | POA: Diagnosis not present

## 2023-01-29 DIAGNOSIS — E559 Vitamin D deficiency, unspecified: Secondary | ICD-10-CM | POA: Diagnosis not present

## 2023-01-29 DIAGNOSIS — I10 Essential (primary) hypertension: Secondary | ICD-10-CM | POA: Diagnosis not present

## 2023-01-29 DIAGNOSIS — E782 Mixed hyperlipidemia: Secondary | ICD-10-CM | POA: Diagnosis not present

## 2023-01-29 DIAGNOSIS — E1165 Type 2 diabetes mellitus with hyperglycemia: Secondary | ICD-10-CM | POA: Diagnosis not present

## 2023-02-05 DIAGNOSIS — E559 Vitamin D deficiency, unspecified: Secondary | ICD-10-CM | POA: Diagnosis not present

## 2023-02-05 DIAGNOSIS — Z7185 Encounter for immunization safety counseling: Secondary | ICD-10-CM | POA: Diagnosis not present

## 2023-02-05 DIAGNOSIS — K219 Gastro-esophageal reflux disease without esophagitis: Secondary | ICD-10-CM | POA: Diagnosis not present

## 2023-02-05 DIAGNOSIS — E782 Mixed hyperlipidemia: Secondary | ICD-10-CM | POA: Diagnosis not present

## 2023-02-05 DIAGNOSIS — E1165 Type 2 diabetes mellitus with hyperglycemia: Secondary | ICD-10-CM | POA: Diagnosis not present

## 2023-02-05 DIAGNOSIS — G2581 Restless legs syndrome: Secondary | ICD-10-CM | POA: Diagnosis not present

## 2023-02-05 DIAGNOSIS — L719 Rosacea, unspecified: Secondary | ICD-10-CM | POA: Diagnosis not present

## 2023-02-05 DIAGNOSIS — E1121 Type 2 diabetes mellitus with diabetic nephropathy: Secondary | ICD-10-CM | POA: Diagnosis not present

## 2023-03-21 ENCOUNTER — Other Ambulatory Visit: Payer: Self-pay | Admitting: Family Medicine

## 2023-03-21 DIAGNOSIS — Z1231 Encounter for screening mammogram for malignant neoplasm of breast: Secondary | ICD-10-CM

## 2023-04-26 ENCOUNTER — Ambulatory Visit
Admission: RE | Admit: 2023-04-26 | Discharge: 2023-04-26 | Disposition: A | Discharge status: Home / Self Care | Payer: Medicare HMO | Source: Ambulatory Visit | Attending: Family Medicine | Admitting: Family Medicine

## 2023-04-26 DIAGNOSIS — Z1231 Encounter for screening mammogram for malignant neoplasm of breast: Secondary | ICD-10-CM | POA: Diagnosis not present

## 2023-05-07 DIAGNOSIS — H0100A Unspecified blepharitis right eye, upper and lower eyelids: Secondary | ICD-10-CM | POA: Diagnosis not present

## 2023-05-07 DIAGNOSIS — H0100B Unspecified blepharitis left eye, upper and lower eyelids: Secondary | ICD-10-CM | POA: Diagnosis not present

## 2023-05-07 DIAGNOSIS — E1165 Type 2 diabetes mellitus with hyperglycemia: Secondary | ICD-10-CM | POA: Diagnosis not present

## 2023-05-07 DIAGNOSIS — H26492 Other secondary cataract, left eye: Secondary | ICD-10-CM | POA: Diagnosis not present

## 2023-05-07 DIAGNOSIS — H04123 Dry eye syndrome of bilateral lacrimal glands: Secondary | ICD-10-CM | POA: Diagnosis not present

## 2023-05-07 DIAGNOSIS — H43813 Vitreous degeneration, bilateral: Secondary | ICD-10-CM | POA: Diagnosis not present

## 2023-05-07 DIAGNOSIS — H52203 Unspecified astigmatism, bilateral: Secondary | ICD-10-CM | POA: Diagnosis not present

## 2023-05-07 DIAGNOSIS — E119 Type 2 diabetes mellitus without complications: Secondary | ICD-10-CM | POA: Diagnosis not present

## 2023-05-07 DIAGNOSIS — H524 Presbyopia: Secondary | ICD-10-CM | POA: Diagnosis not present

## 2023-05-07 DIAGNOSIS — E782 Mixed hyperlipidemia: Secondary | ICD-10-CM | POA: Diagnosis not present

## 2023-05-10 DIAGNOSIS — E1165 Type 2 diabetes mellitus with hyperglycemia: Secondary | ICD-10-CM | POA: Diagnosis not present

## 2023-05-10 DIAGNOSIS — K219 Gastro-esophageal reflux disease without esophagitis: Secondary | ICD-10-CM | POA: Diagnosis not present

## 2023-05-10 DIAGNOSIS — G2581 Restless legs syndrome: Secondary | ICD-10-CM | POA: Diagnosis not present

## 2023-05-10 DIAGNOSIS — Z789 Other specified health status: Secondary | ICD-10-CM | POA: Diagnosis not present

## 2023-05-10 DIAGNOSIS — E782 Mixed hyperlipidemia: Secondary | ICD-10-CM | POA: Diagnosis not present

## 2023-05-10 DIAGNOSIS — I10 Essential (primary) hypertension: Secondary | ICD-10-CM | POA: Diagnosis not present

## 2023-05-10 DIAGNOSIS — L719 Rosacea, unspecified: Secondary | ICD-10-CM | POA: Diagnosis not present

## 2023-05-10 IMAGING — MG MM DIGITAL DIAGNOSTIC UNILAT*R* W/ TOMO W/ CAD
6 series · 6 of 18 positions shown · non-contrast
Comparison: Previous exam(s).

CLINICAL DATA: The patient was called back for possible right
breast distortion.

EXAM:
DIGITAL DIAGNOSTIC UNILATERAL RIGHT MAMMOGRAM WITH TOMOSYNTHESIS AND
CAD
TECHNIQUE: Right digital diagnostic mammography and breast tomosynthesis was
performed. The images were evaluated with computer-aided detection.

[R MLO synth-2D]
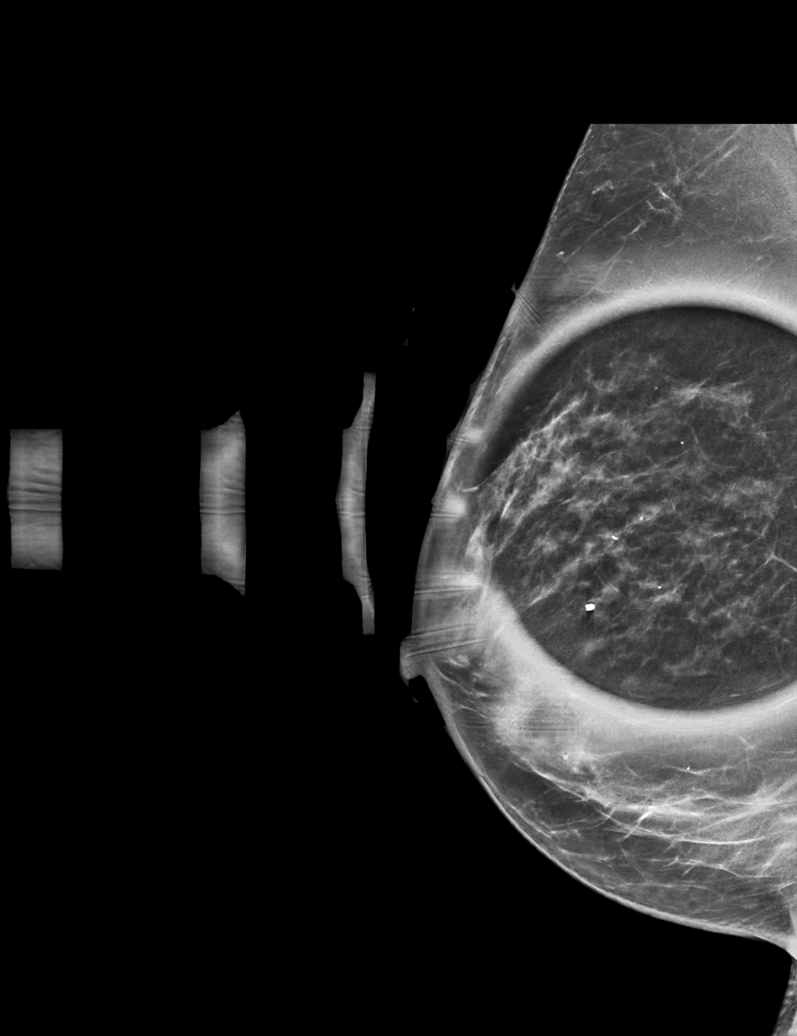

[R ML synth-2D]
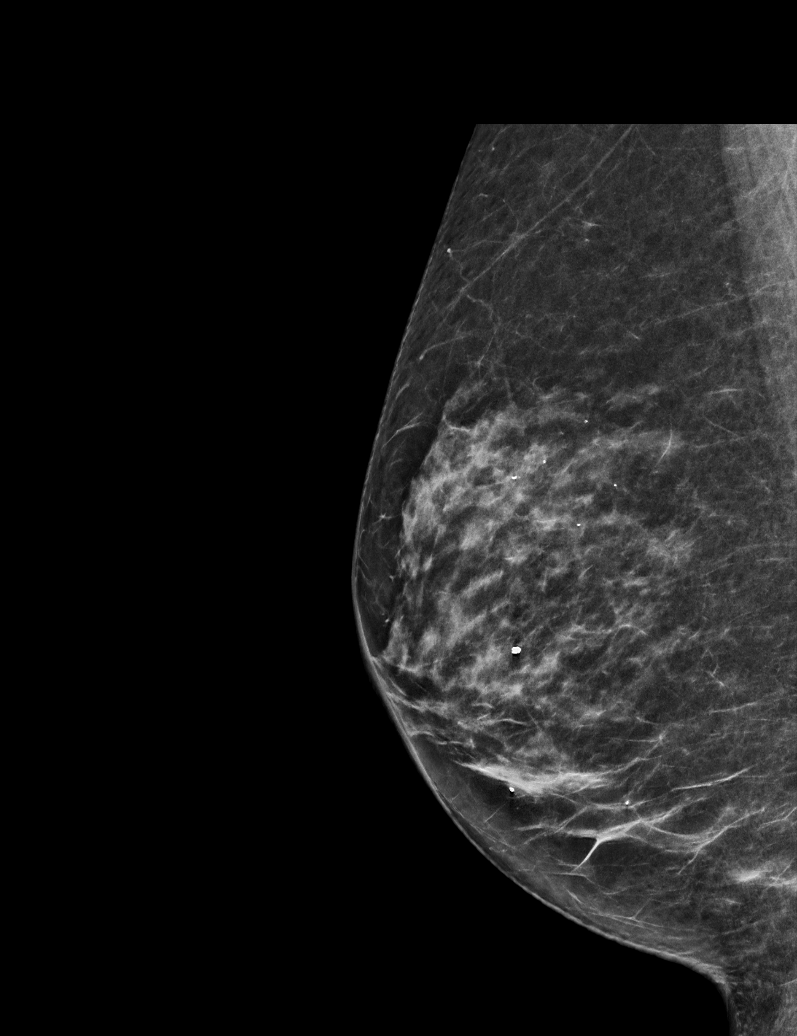

[R CC synth-2D]
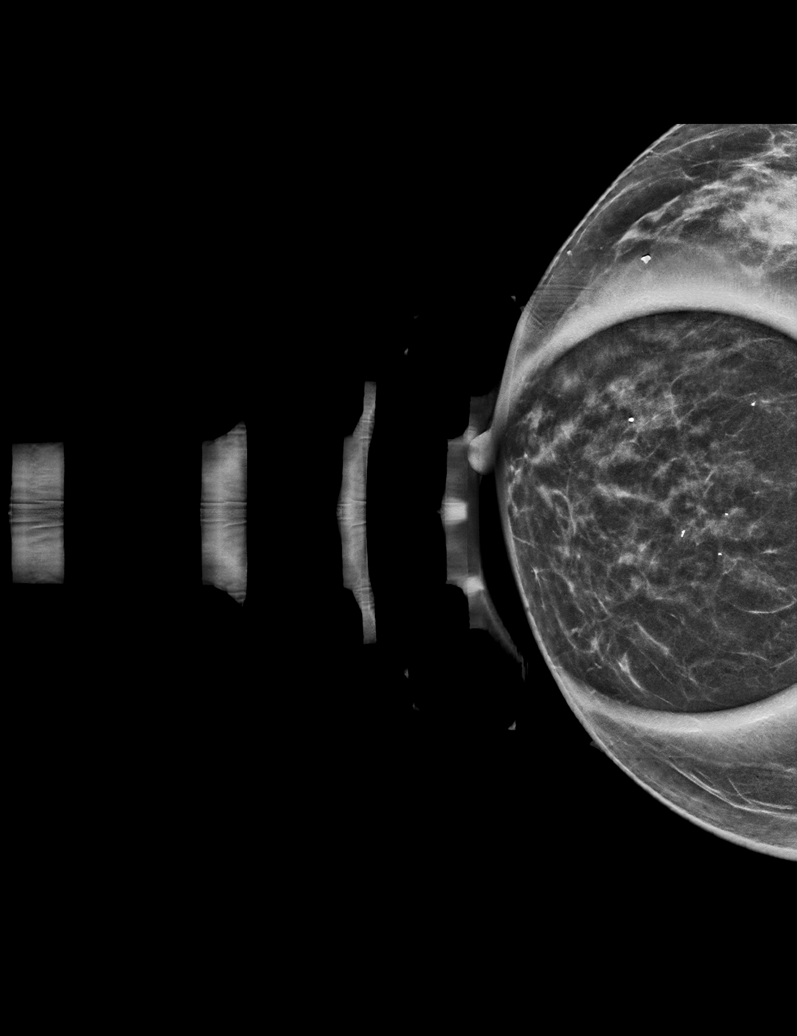

[R CC tomo · tomo slice 27/54.0]
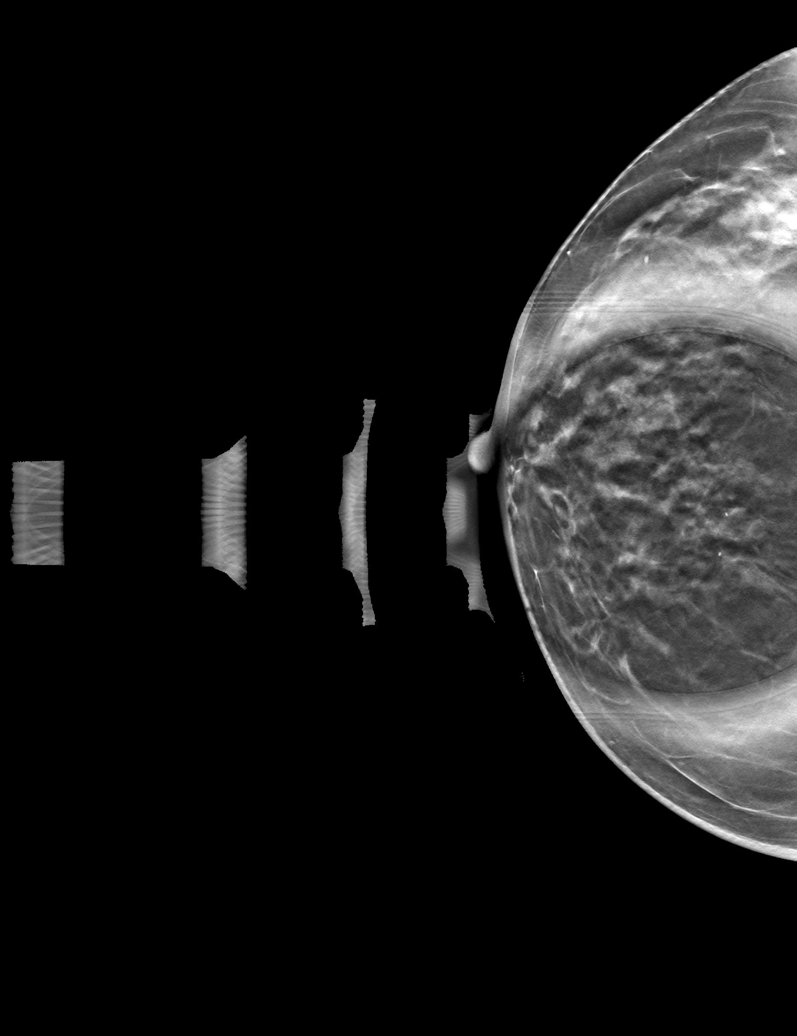

[R ML tomo · tomo slice 33/64.0]
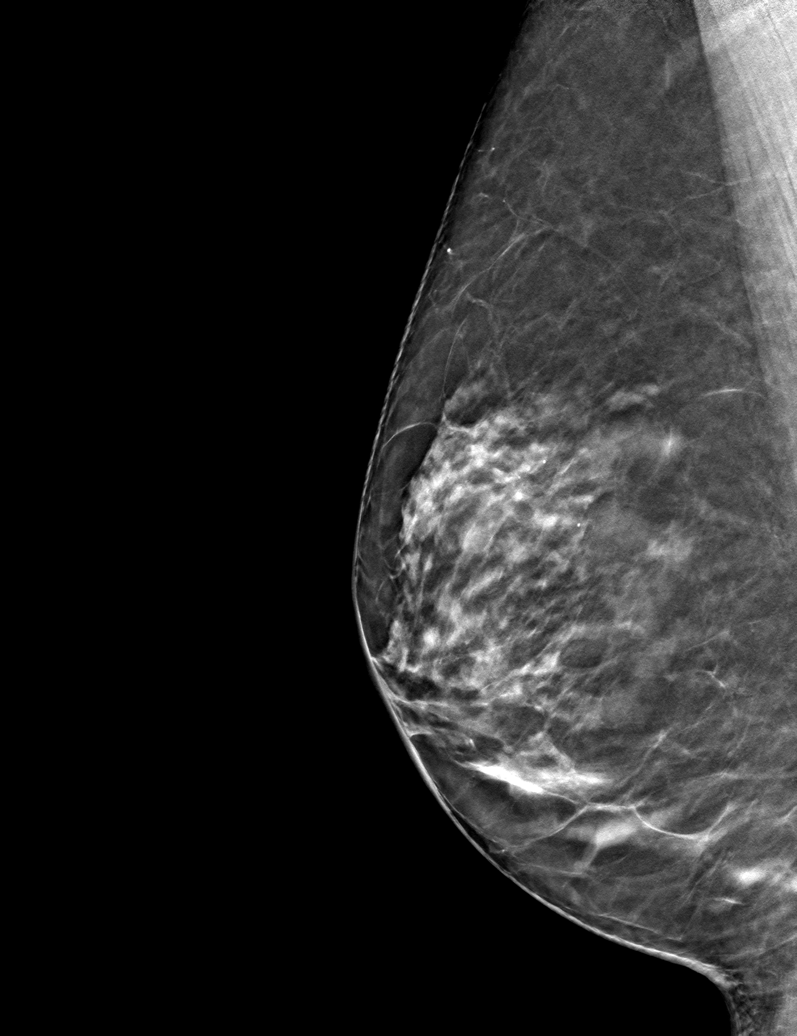

[R MLO tomo · tomo slice 28/55.0]
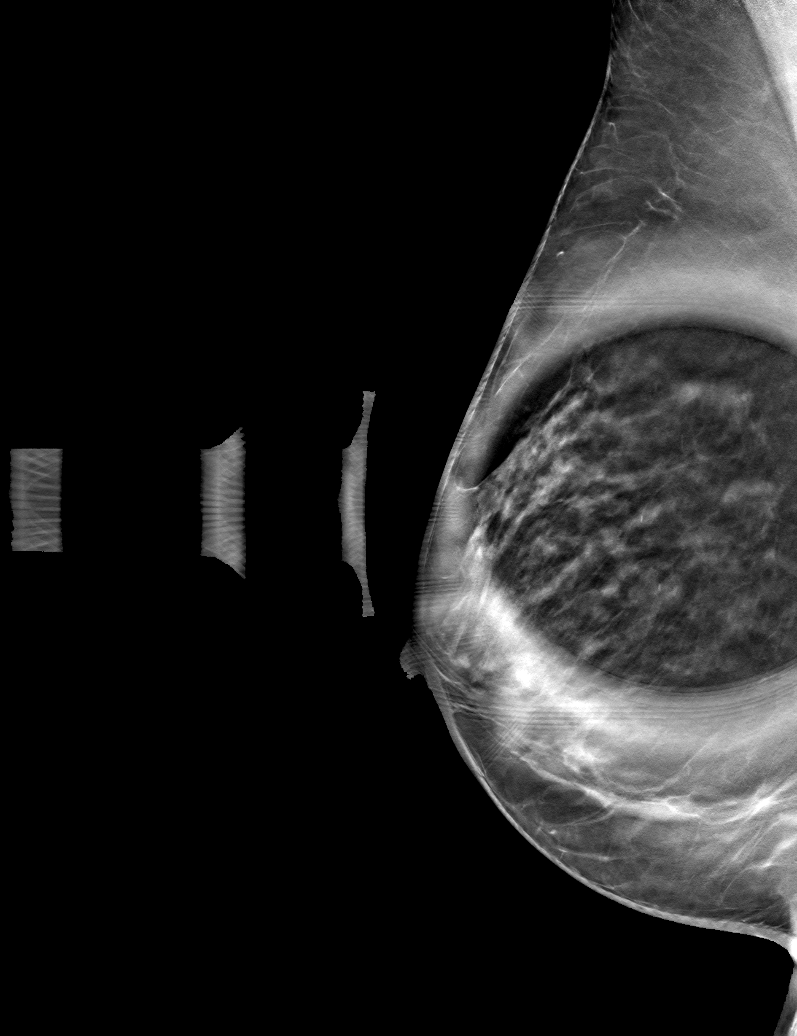

[6 of 18 positions shown; findings below may reference images not displayed]

ACR Breast Density Category c: The breast tissue is heterogeneously
dense, which may obscure small masses.
FINDINGS: The possible right breast distortion resolves on today's imaging.
IMPRESSION: Resolution previously identified possible right breast distortion.
No evidence of malignancy.

RECOMMENDATION:
Annual screening mammography.

I have discussed the findings and recommendations with the patient.
If applicable, a reminder letter will be sent to the patient
regarding the next appointment.

BI-RADS CATEGORY  2: Benign.

## 2023-08-09 DIAGNOSIS — R5383 Other fatigue: Secondary | ICD-10-CM | POA: Diagnosis not present

## 2023-08-09 DIAGNOSIS — E1165 Type 2 diabetes mellitus with hyperglycemia: Secondary | ICD-10-CM | POA: Diagnosis not present

## 2023-08-09 DIAGNOSIS — R739 Hyperglycemia, unspecified: Secondary | ICD-10-CM | POA: Diagnosis not present

## 2023-08-09 DIAGNOSIS — I10 Essential (primary) hypertension: Secondary | ICD-10-CM | POA: Diagnosis not present

## 2023-08-09 DIAGNOSIS — E559 Vitamin D deficiency, unspecified: Secondary | ICD-10-CM | POA: Diagnosis not present

## 2023-08-16 DIAGNOSIS — Z23 Encounter for immunization: Secondary | ICD-10-CM | POA: Diagnosis not present

## 2023-08-16 DIAGNOSIS — E782 Mixed hyperlipidemia: Secondary | ICD-10-CM | POA: Diagnosis not present

## 2023-08-16 DIAGNOSIS — E559 Vitamin D deficiency, unspecified: Secondary | ICD-10-CM | POA: Diagnosis not present

## 2023-08-16 DIAGNOSIS — E1159 Type 2 diabetes mellitus with other circulatory complications: Secondary | ICD-10-CM | POA: Diagnosis not present

## 2023-08-16 DIAGNOSIS — L719 Rosacea, unspecified: Secondary | ICD-10-CM | POA: Diagnosis not present

## 2023-08-16 DIAGNOSIS — E1169 Type 2 diabetes mellitus with other specified complication: Secondary | ICD-10-CM | POA: Diagnosis not present

## 2023-08-16 DIAGNOSIS — G2581 Restless legs syndrome: Secondary | ICD-10-CM | POA: Diagnosis not present

## 2023-08-16 DIAGNOSIS — I10 Essential (primary) hypertension: Secondary | ICD-10-CM | POA: Diagnosis not present

## 2023-08-16 DIAGNOSIS — I152 Hypertension secondary to endocrine disorders: Secondary | ICD-10-CM | POA: Diagnosis not present

## 2023-08-16 DIAGNOSIS — E1165 Type 2 diabetes mellitus with hyperglycemia: Secondary | ICD-10-CM | POA: Diagnosis not present

## 2023-08-21 DIAGNOSIS — K219 Gastro-esophageal reflux disease without esophagitis: Secondary | ICD-10-CM | POA: Diagnosis not present

## 2023-08-21 DIAGNOSIS — I10 Essential (primary) hypertension: Secondary | ICD-10-CM | POA: Diagnosis not present

## 2023-08-21 DIAGNOSIS — E1169 Type 2 diabetes mellitus with other specified complication: Secondary | ICD-10-CM | POA: Diagnosis not present

## 2023-08-21 DIAGNOSIS — Z13220 Encounter for screening for lipoid disorders: Secondary | ICD-10-CM | POA: Diagnosis not present

## 2023-08-21 DIAGNOSIS — Z114 Encounter for screening for human immunodeficiency virus [HIV]: Secondary | ICD-10-CM | POA: Diagnosis not present

## 2023-08-21 DIAGNOSIS — E782 Mixed hyperlipidemia: Secondary | ICD-10-CM | POA: Diagnosis not present

## 2023-08-21 DIAGNOSIS — M8580 Other specified disorders of bone density and structure, unspecified site: Secondary | ICD-10-CM | POA: Diagnosis not present

## 2023-08-21 DIAGNOSIS — Z0000 Encounter for general adult medical examination without abnormal findings: Secondary | ICD-10-CM | POA: Diagnosis not present

## 2023-08-27 DIAGNOSIS — E782 Mixed hyperlipidemia: Secondary | ICD-10-CM | POA: Diagnosis not present

## 2023-08-27 DIAGNOSIS — Z0000 Encounter for general adult medical examination without abnormal findings: Secondary | ICD-10-CM | POA: Diagnosis not present

## 2023-08-27 DIAGNOSIS — R7989 Other specified abnormal findings of blood chemistry: Secondary | ICD-10-CM | POA: Diagnosis not present

## 2023-08-27 DIAGNOSIS — H6123 Impacted cerumen, bilateral: Secondary | ICD-10-CM | POA: Diagnosis not present

## 2023-10-02 DIAGNOSIS — R7989 Other specified abnormal findings of blood chemistry: Secondary | ICD-10-CM | POA: Diagnosis not present

## 2023-10-02 DIAGNOSIS — I10 Essential (primary) hypertension: Secondary | ICD-10-CM | POA: Diagnosis not present

## 2023-10-02 DIAGNOSIS — R5383 Other fatigue: Secondary | ICD-10-CM | POA: Diagnosis not present

## 2023-10-08 DIAGNOSIS — R7989 Other specified abnormal findings of blood chemistry: Secondary | ICD-10-CM | POA: Diagnosis not present

## 2023-10-08 DIAGNOSIS — I10 Essential (primary) hypertension: Secondary | ICD-10-CM | POA: Diagnosis not present

## 2023-11-06 DIAGNOSIS — Z1339 Encounter for screening examination for other mental health and behavioral disorders: Secondary | ICD-10-CM | POA: Diagnosis not present

## 2023-11-06 DIAGNOSIS — Z0000 Encounter for general adult medical examination without abnormal findings: Secondary | ICD-10-CM | POA: Diagnosis not present

## 2023-11-06 DIAGNOSIS — I10 Essential (primary) hypertension: Secondary | ICD-10-CM | POA: Diagnosis not present

## 2023-11-06 DIAGNOSIS — Z1331 Encounter for screening for depression: Secondary | ICD-10-CM | POA: Diagnosis not present

## 2023-11-06 DIAGNOSIS — R5383 Other fatigue: Secondary | ICD-10-CM | POA: Diagnosis not present

## 2023-11-06 DIAGNOSIS — R739 Hyperglycemia, unspecified: Secondary | ICD-10-CM | POA: Diagnosis not present

## 2023-11-13 DIAGNOSIS — E1121 Type 2 diabetes mellitus with diabetic nephropathy: Secondary | ICD-10-CM | POA: Diagnosis not present

## 2023-11-13 DIAGNOSIS — I129 Hypertensive chronic kidney disease with stage 1 through stage 4 chronic kidney disease, or unspecified chronic kidney disease: Secondary | ICD-10-CM | POA: Diagnosis not present

## 2023-11-13 DIAGNOSIS — E1122 Type 2 diabetes mellitus with diabetic chronic kidney disease: Secondary | ICD-10-CM | POA: Diagnosis not present

## 2023-11-13 DIAGNOSIS — E782 Mixed hyperlipidemia: Secondary | ICD-10-CM | POA: Diagnosis not present

## 2023-11-13 DIAGNOSIS — E1165 Type 2 diabetes mellitus with hyperglycemia: Secondary | ICD-10-CM | POA: Diagnosis not present

## 2023-11-13 DIAGNOSIS — Z789 Other specified health status: Secondary | ICD-10-CM | POA: Diagnosis not present

## 2023-11-13 DIAGNOSIS — L719 Rosacea, unspecified: Secondary | ICD-10-CM | POA: Diagnosis not present

## 2023-11-13 DIAGNOSIS — I10 Essential (primary) hypertension: Secondary | ICD-10-CM | POA: Diagnosis not present

## 2023-11-13 DIAGNOSIS — E785 Hyperlipidemia, unspecified: Secondary | ICD-10-CM | POA: Diagnosis not present

## 2024-02-26 DIAGNOSIS — E559 Vitamin D deficiency, unspecified: Secondary | ICD-10-CM | POA: Diagnosis not present

## 2024-02-26 DIAGNOSIS — R739 Hyperglycemia, unspecified: Secondary | ICD-10-CM | POA: Diagnosis not present

## 2024-02-26 DIAGNOSIS — R5383 Other fatigue: Secondary | ICD-10-CM | POA: Diagnosis not present

## 2024-03-04 DIAGNOSIS — J309 Allergic rhinitis, unspecified: Secondary | ICD-10-CM | POA: Diagnosis not present

## 2024-03-04 DIAGNOSIS — E559 Vitamin D deficiency, unspecified: Secondary | ICD-10-CM | POA: Diagnosis not present

## 2024-03-04 DIAGNOSIS — E1165 Type 2 diabetes mellitus with hyperglycemia: Secondary | ICD-10-CM | POA: Diagnosis not present

## 2024-03-04 DIAGNOSIS — I10 Essential (primary) hypertension: Secondary | ICD-10-CM | POA: Diagnosis not present

## 2024-03-18 ENCOUNTER — Other Ambulatory Visit: Payer: Self-pay | Admitting: Family Medicine

## 2024-03-18 DIAGNOSIS — Z1231 Encounter for screening mammogram for malignant neoplasm of breast: Secondary | ICD-10-CM

## 2024-04-28 ENCOUNTER — Ambulatory Visit
Admission: RE | Admit: 2024-04-28 | Discharge: 2024-04-28 | Disposition: A | Discharge status: Home / Self Care | Source: Ambulatory Visit | Attending: Family Medicine | Admitting: Family Medicine

## 2024-04-28 DIAGNOSIS — Z1231 Encounter for screening mammogram for malignant neoplasm of breast: Secondary | ICD-10-CM

## 2024-05-08 DIAGNOSIS — H43813 Vitreous degeneration, bilateral: Secondary | ICD-10-CM | POA: Diagnosis not present

## 2024-05-08 DIAGNOSIS — H04123 Dry eye syndrome of bilateral lacrimal glands: Secondary | ICD-10-CM | POA: Diagnosis not present

## 2024-05-08 DIAGNOSIS — H52203 Unspecified astigmatism, bilateral: Secondary | ICD-10-CM | POA: Diagnosis not present

## 2024-05-08 DIAGNOSIS — E119 Type 2 diabetes mellitus without complications: Secondary | ICD-10-CM | POA: Diagnosis not present

## 2024-05-08 DIAGNOSIS — H524 Presbyopia: Secondary | ICD-10-CM | POA: Diagnosis not present

## 2024-05-08 DIAGNOSIS — H26492 Other secondary cataract, left eye: Secondary | ICD-10-CM | POA: Diagnosis not present

## 2024-05-29 DIAGNOSIS — R739 Hyperglycemia, unspecified: Secondary | ICD-10-CM | POA: Diagnosis not present

## 2024-05-29 DIAGNOSIS — R5383 Other fatigue: Secondary | ICD-10-CM | POA: Diagnosis not present

## 2024-06-04 DIAGNOSIS — Z79899 Other long term (current) drug therapy: Secondary | ICD-10-CM | POA: Diagnosis not present

## 2024-06-04 DIAGNOSIS — E039 Hypothyroidism, unspecified: Secondary | ICD-10-CM | POA: Diagnosis not present

## 2024-06-04 DIAGNOSIS — Z7984 Long term (current) use of oral hypoglycemic drugs: Secondary | ICD-10-CM | POA: Diagnosis not present

## 2024-06-04 DIAGNOSIS — L719 Rosacea, unspecified: Secondary | ICD-10-CM | POA: Diagnosis not present

## 2024-06-04 DIAGNOSIS — I10 Essential (primary) hypertension: Secondary | ICD-10-CM | POA: Diagnosis not present

## 2024-06-04 DIAGNOSIS — I129 Hypertensive chronic kidney disease with stage 1 through stage 4 chronic kidney disease, or unspecified chronic kidney disease: Secondary | ICD-10-CM | POA: Diagnosis not present

## 2024-06-04 DIAGNOSIS — E1122 Type 2 diabetes mellitus with diabetic chronic kidney disease: Secondary | ICD-10-CM | POA: Diagnosis not present

## 2024-06-04 DIAGNOSIS — E1165 Type 2 diabetes mellitus with hyperglycemia: Secondary | ICD-10-CM | POA: Diagnosis not present

## 2024-06-04 DIAGNOSIS — E1121 Type 2 diabetes mellitus with diabetic nephropathy: Secondary | ICD-10-CM | POA: Diagnosis not present

## 2024-06-04 DIAGNOSIS — Z7985 Long-term (current) use of injectable non-insulin antidiabetic drugs: Secondary | ICD-10-CM | POA: Diagnosis not present

## 2024-08-26 ENCOUNTER — Encounter: Payer: Self-pay | Admitting: *Deleted

## 2024-08-26 DIAGNOSIS — Z13220 Encounter for screening for lipoid disorders: Secondary | ICD-10-CM | POA: Diagnosis not present

## 2024-08-26 NOTE — Progress Notes (Signed)
 Joy Mercado                                          MRN: [id number]   08/26/2024   The [facility] Quality Team Specialist reviewed this patient medical record for the purposes of chart review for care gap closure. The following were reviewed: chart review for care gap closure-kidney health evaluation for diabetes:eGFR  and uACR.    [facility] Quality Team

## 2024-09-02 DIAGNOSIS — Z0000 Encounter for general adult medical examination without abnormal findings: Secondary | ICD-10-CM | POA: Diagnosis not present

## 2024-09-02 DIAGNOSIS — Z7185 Encounter for immunization safety counseling: Secondary | ICD-10-CM | POA: Diagnosis not present

## 2024-09-02 DIAGNOSIS — E1165 Type 2 diabetes mellitus with hyperglycemia: Secondary | ICD-10-CM | POA: Diagnosis not present

## 2024-09-02 DIAGNOSIS — Z23 Encounter for immunization: Secondary | ICD-10-CM | POA: Diagnosis not present

## 2024-09-16 DIAGNOSIS — E1165 Type 2 diabetes mellitus with hyperglycemia: Secondary | ICD-10-CM | POA: Diagnosis not present

## 2024-09-16 DIAGNOSIS — E559 Vitamin D deficiency, unspecified: Secondary | ICD-10-CM | POA: Diagnosis not present

## 2024-09-22 DIAGNOSIS — E1165 Type 2 diabetes mellitus with hyperglycemia: Secondary | ICD-10-CM | POA: Diagnosis not present

## 2024-09-22 DIAGNOSIS — E782 Mixed hyperlipidemia: Secondary | ICD-10-CM | POA: Diagnosis not present

## 2024-09-22 DIAGNOSIS — E1169 Type 2 diabetes mellitus with other specified complication: Secondary | ICD-10-CM | POA: Diagnosis not present

## 2024-09-22 DIAGNOSIS — I10 Essential (primary) hypertension: Secondary | ICD-10-CM | POA: Diagnosis not present

## 2024-09-22 DIAGNOSIS — E1143 Type 2 diabetes mellitus with diabetic autonomic (poly)neuropathy: Secondary | ICD-10-CM | POA: Diagnosis not present

## 2024-09-22 DIAGNOSIS — I152 Hypertension secondary to endocrine disorders: Secondary | ICD-10-CM | POA: Diagnosis not present

## 2024-09-22 DIAGNOSIS — E1159 Type 2 diabetes mellitus with other circulatory complications: Secondary | ICD-10-CM | POA: Diagnosis not present

## 2024-09-22 DIAGNOSIS — Z7985 Long-term (current) use of injectable non-insulin antidiabetic drugs: Secondary | ICD-10-CM | POA: Diagnosis not present

## 2024-09-22 DIAGNOSIS — E559 Vitamin D deficiency, unspecified: Secondary | ICD-10-CM | POA: Diagnosis not present

## 2024-09-22 DIAGNOSIS — L719 Rosacea, unspecified: Secondary | ICD-10-CM | POA: Diagnosis not present

## 2024-09-22 DIAGNOSIS — Z7984 Long term (current) use of oral hypoglycemic drugs: Secondary | ICD-10-CM | POA: Diagnosis not present
